# Patient Record
Sex: Female | Born: 1935 | Race: Black or African American | Hispanic: No
Health system: Southern US, Community
[De-identification: ages and names within clinical notes are randomized; demographics above are authoritative.]

## PROBLEM LIST (undated history)

## (undated) DIAGNOSIS — F0280 Dementia in other diseases classified elsewhere without behavioral disturbance: Secondary | ICD-10-CM

## (undated) DIAGNOSIS — I4891 Unspecified atrial fibrillation: Secondary | ICD-10-CM

## (undated) DIAGNOSIS — K5641 Fecal impaction: Secondary | ICD-10-CM

## (undated) DIAGNOSIS — K5900 Constipation, unspecified: Secondary | ICD-10-CM

## (undated) DIAGNOSIS — G309 Alzheimer's disease, unspecified: Secondary | ICD-10-CM

## (undated) DIAGNOSIS — J189 Pneumonia, unspecified organism: Secondary | ICD-10-CM

## (undated) DIAGNOSIS — I10 Essential (primary) hypertension: Secondary | ICD-10-CM

## (undated) DIAGNOSIS — M1990 Unspecified osteoarthritis, unspecified site: Secondary | ICD-10-CM

## (undated) HISTORY — PX: TOTAL KNEE ARTHROPLASTY: SHX125

---

## 2005-12-18 ENCOUNTER — Encounter: Payer: Self-pay | Admitting: Family Medicine

## 2005-12-27 ENCOUNTER — Encounter: Payer: Self-pay | Admitting: Family Medicine

## 2006-01-27 ENCOUNTER — Encounter: Payer: Self-pay | Admitting: Family Medicine

## 2017-03-16 ENCOUNTER — Emergency Department (HOSPITAL_COMMUNITY): Payer: Medicare Other

## 2017-03-16 ENCOUNTER — Emergency Department (HOSPITAL_COMMUNITY)
Admission: EM | Admit: 2017-03-16 | Discharge: 2017-03-16 | Disposition: A | Discharge status: Home / Self Care | Payer: Medicare Other | Attending: Emergency Medicine | Admitting: Emergency Medicine

## 2017-03-16 ENCOUNTER — Encounter (HOSPITAL_COMMUNITY): Payer: Self-pay | Admitting: *Deleted

## 2017-03-16 DIAGNOSIS — I10 Essential (primary) hypertension: Secondary | ICD-10-CM | POA: Insufficient documentation

## 2017-03-16 DIAGNOSIS — G309 Alzheimer's disease, unspecified: Secondary | ICD-10-CM | POA: Diagnosis not present

## 2017-03-16 DIAGNOSIS — K5939 Other megacolon: Secondary | ICD-10-CM | POA: Diagnosis not present

## 2017-03-16 DIAGNOSIS — K5900 Constipation, unspecified: Secondary | ICD-10-CM

## 2017-03-16 DIAGNOSIS — Z79899 Other long term (current) drug therapy: Secondary | ICD-10-CM | POA: Diagnosis not present

## 2017-03-16 DIAGNOSIS — R52 Pain, unspecified: Secondary | ICD-10-CM

## 2017-03-16 DIAGNOSIS — R05 Cough: Secondary | ICD-10-CM | POA: Diagnosis present

## 2017-03-16 HISTORY — DX: Unspecified atrial fibrillation: I48.91

## 2017-03-16 HISTORY — DX: Fecal impaction: K56.41

## 2017-03-16 HISTORY — DX: Constipation, unspecified: K59.00

## 2017-03-16 HISTORY — DX: Pneumonia, unspecified organism: J18.9

## 2017-03-16 HISTORY — DX: Unspecified osteoarthritis, unspecified site: M19.90

## 2017-03-16 HISTORY — DX: Alzheimer's disease, unspecified: G30.9

## 2017-03-16 HISTORY — DX: Dementia in other diseases classified elsewhere, unspecified severity, without behavioral disturbance, psychotic disturbance, mood disturbance, and anxiety: F02.80

## 2017-03-16 HISTORY — DX: Essential (primary) hypertension: I10

## 2017-03-16 LAB — COMPREHENSIVE METABOLIC PANEL
ALT: 7 U/L — AB (ref 14–54)
AST: 26 U/L (ref 15–41)
Albumin: 3.5 g/dL (ref 3.5–5.0)
Alkaline Phosphatase: 56 U/L (ref 38–126)
Anion gap: 8 (ref 5–15)
BILIRUBIN TOTAL: 0.6 mg/dL (ref 0.3–1.2)
BUN: 12 mg/dL (ref 6–20)
CO2: 25 mmol/L (ref 22–32)
CREATININE: 0.73 mg/dL (ref 0.44–1.00)
Calcium: 9.2 mg/dL (ref 8.9–10.3)
Chloride: 105 mmol/L (ref 101–111)
Glucose, Bld: 138 mg/dL — ABNORMAL HIGH (ref 65–99)
Potassium: 3.4 mmol/L — ABNORMAL LOW (ref 3.5–5.1)
Sodium: 138 mmol/L (ref 135–145)
Total Protein: 6.7 g/dL (ref 6.5–8.1)

## 2017-03-16 LAB — CBC WITH DIFFERENTIAL/PLATELET
Basophils Absolute: 0 10*3/uL (ref 0.0–0.1)
Basophils Relative: 0 %
EOS PCT: 0 %
Eosinophils Absolute: 0 10*3/uL (ref 0.0–0.7)
HEMATOCRIT: 35.4 % — AB (ref 36.0–46.0)
HEMOGLOBIN: 12 g/dL (ref 12.0–15.0)
LYMPHS ABS: 1.5 10*3/uL (ref 0.7–4.0)
LYMPHS PCT: 37 %
MCH: 31.5 pg (ref 26.0–34.0)
MCHC: 33.9 g/dL (ref 30.0–36.0)
MCV: 92.9 fL (ref 78.0–100.0)
Monocytes Absolute: 0.5 10*3/uL (ref 0.1–1.0)
Monocytes Relative: 12 %
NEUTROS ABS: 2 10*3/uL (ref 1.7–7.7)
Neutrophils Relative %: 51 %
PLATELETS: 136 10*3/uL — AB (ref 150–400)
RBC: 3.81 MIL/uL — AB (ref 3.87–5.11)
RDW: 14.5 % (ref 11.5–15.5)
WBC: 3.9 10*3/uL — AB (ref 4.0–10.5)

## 2017-03-16 LAB — LIPASE, BLOOD: LIPASE: 23 U/L (ref 11–51)

## 2017-03-16 MED ORDER — SODIUM CHLORIDE 0.9 % IV BOLUS (SEPSIS)
500.0000 mL | Freq: Once | INTRAVENOUS | Status: AC
  Administered 2017-03-16: 500 mL via INTRAVENOUS

## 2017-03-16 MED ORDER — IOPAMIDOL (ISOVUE-300) INJECTION 61%
INTRAVENOUS | Status: AC
  Filled 2017-03-16: qty 30

## 2017-03-16 NOTE — ED Triage Notes (Signed)
Sent by [facility], cxr done and noted "free floating air"

## 2017-03-16 NOTE — ED Notes (Signed)
Pt returned from xray at this time.

## 2017-03-16 NOTE — ED Notes (Signed)
No family in room or lobby at this time. This RN contacted emergency contact and left message in attempts to d/c pt.

## 2017-03-16 NOTE — Discharge Instructions (Signed)
CT scan shows a dilated bowel, but this appears to be a chronic condition. Also she has some constipation issues. Recommend magnesium citrate, Miralax, fleets enema.

## 2017-03-16 NOTE — ED Notes (Signed)
EKG given to MD Cook. 

## 2017-03-16 NOTE — ED Notes (Signed)
Patient transported to X-ray 

## 2017-03-16 NOTE — ED Triage Notes (Signed)
Pt has never been here with [facility],  Normally goes to [facility]

## 2017-03-16 NOTE — ED Notes (Signed)
Pt to CT at this time.

## 2017-03-16 NOTE — ED Provider Notes (Signed)
[facility]-EMERGENCY DEPT Provider Note   CSN: [id number] Arrival date & time: 03/16/17  1538     History   Chief Complaint Chief Complaint  Patient presents with  . Cough    HPI Isabel Beck is a 83 y.o. female.  Level V caveat for dementia.   Patient was seen by her primary care doctor today for a cough.  A chest x-ray was performed. It was determined that she had "free air" on her x-ray. She was sent to the emergency department for further evaluation. No fever, chills, productive sputum, abdominal pain. She does have abdominal distention, but this is not new.      Past Medical History:  Diagnosis Date  . A-fib (HCC)   . Alzheimer disease   . Arthritis   . Constipation   . Fecal impaction (HCC)   . Hypertension   . Pneumonia     There are no active problems to display for this patient.   Past Surgical History:  Procedure Laterality Date  . TOTAL KNEE ARTHROPLASTY      OB History    No data available       Home Medications    Prior to Admission medications   Medication Sig Start Date End Date Taking? Authorizing Provider  acetaminophen (TYLENOL) 500 MG tablet Take 500 mg by mouth every 6 (six) hours as needed for mild pain.   Yes [provider]  amLODipine (NORVASC) 10 MG tablet Take 10 mg by mouth daily.    Yes [provider]  Calcium Carb-Cholecalciferol (CALCIUM 500+D) 500-400 MG-UNIT TABS Take 1 tablet by mouth 2 (two) times daily.   Yes [provider]  docusate sodium (COLACE) 100 MG capsule Take 100 mg by mouth daily.    Yes [provider]  ferrous sulfate 325 (65 FE) MG tablet Take 325 mg by mouth daily with breakfast.   Yes [provider]  Infant Care Products (DERMACLOUD) CREA Apply 1 application topically daily as needed.   Yes [provider]  lactulose (CHRONULAC) 10 GM/15ML solution Take 20 g by mouth daily as needed for mild constipation.   Yes [provider]  omeprazole (PRILOSEC)  20 MG capsule Take 20 mg by mouth every morning.   Yes [provider]  polyethylene glycol powder (GLYCOLAX/MIRALAX) powder Take 17 g by mouth daily.   Yes [provider]  risperiDONE (RISPERDAL) 1 MG tablet Take 1 mg by mouth at bedtime.    Yes [provider]  senna (SENOKOT) 8.6 MG tablet Take 1 tablet by mouth daily as needed for constipation.    Yes [provider]    Family History No family history on file.  Social History Social History  Substance Use Topics  . Smoking status: Unknown If Ever Smoked  . Smokeless tobacco: Not on file  . Alcohol use Not on file     Allergies   Lovastatin; Pregabalin; and Sulfa antibiotics   Review of Systems Review of Systems  Unable to perform ROS: Dementia     Physical Exam Updated Vital Signs BP 133/75   Pulse 74   Temp 98.9 F (37.2 C) (Oral)   Resp (!) 21   SpO2 99%   Physical Exam  Constitutional:  Demented, pleasant, no acute distress  HENT:  Head: Normocephalic and atraumatic.  Eyes: Conjunctivae are normal.  Neck: Neck supple.  Cardiovascular: Normal rate and regular rhythm.   Pulmonary/Chest: Effort normal and breath sounds normal.  Abdominal:  Distended, but no acute  abdomen  Musculoskeletal: Normal range of motion.  Neurological: She is alert. Cranial nerve deficit:  another CT at a.  Skin: Skin is warm and dry.  Psychiatric:  demented  Nursing note and vitals reviewed.    ED Treatments / Results  Labs (all labs ordered are listed, but only abnormal results are displayed) Labs Reviewed  CBC WITH DIFFERENTIAL/PLATELET - Abnormal; Notable for the following:       Result Value   WBC 3.9 (*)    RBC 3.81 (*)    HCT 35.4 (*)    Platelets 136 (*)    All other components within normal limits  COMPREHENSIVE METABOLIC PANEL - Abnormal; Notable for the following:    Potassium 3.4 (*)    Glucose, Bld 138 (*)    ALT 7 (*)    All other components within normal limits    LIPASE, BLOOD    EKG  EKG Interpretation  Date/Time:  Tuesday March 16 2017 16:09:14 EDT Ventricular Rate:  70 PR Interval:  152 QRS Duration: 82 QT Interval:  398 QTC Calculation: 429 R Axis:   -27 Text Interpretation:  Sinus rhythm with marked sinus arrhythmia Otherwise normal ECG Confirmed by Donnetta Hutching ([id number]) on 03/16/2017 5:19:29 PM       Radiology Ct Abdomen Pelvis Wo Contrast  Result Date: 03/16/2017 CLINICAL DATA:  Abdominal distention. EXAM: CT ABDOMEN AND PELVIS WITHOUT CONTRAST TECHNIQUE: Multidetector CT imaging of the abdomen and pelvis was performed following the standard protocol without IV contrast. COMPARISON:  Acute abdominal series from earlier today FINDINGS: Lower chest: There is low lung volumes with atelectatic opacity. Generalized airway thickening. Extensive atherosclerotic calcification in the coronaries. Hepatobiliary: No significant hepatic finding.No evidence of biliary obstruction or stone. Pancreas: Generalized atrophy.  No acute finding. Spleen: Unremarkable. Adrenals/Urinary Tract: Negative adrenals. No hydronephrosis or stone. 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 More inferiorly is a similarly sized cystic density. Deep portions of the bladder are obscured by streak artifact from hip prosthesis. No acute bladder finding. Stomach/Bowel: The sigmoid colon is patulous and tortuous, reaching the diaphragm. The lumen measures up to 12 cm in diameter. There is semi formed stool in the distal sigmoid and rectum without over distension at these levels to suggest that the stool is obstructive. No obstruction or mesenteric twist. No evidence of small bowel obstruction. Negative appendix. Vascular/Lymphatic: No acute vascular abnormality. Atherosclerotic calcification. No mass or adenopathy. Reproductive:No pathologic findings. Other: No ascites or pneumoperitoneum. Musculoskeletal: Left hip arthroplasty and severe right hip  osteoarthritis. Spondylosis and multilevel ankylosis of the thoracic spine. Advanced lumbar disc and facet degeneration. No acute osseous finding. IMPRESSION: 1. Dilated and tortuous sigmoid colon without volvulus. The distal sigmoid and rectum contains stool that is not convincingly obstructive. A similar pattern was described on outside abdominal CT report in care everywhere, suggesting chronic patulous colon rather than a colonic ileus. 2. Chronic findings are described above. Electronically Signed   By: Marnee Spring M.D.   On: 03/16/2017 20:40   Dg Abd Acute W/chest  Result Date: 03/16/2017 CLINICAL DATA:  Abdominal pain. EXAM: DG ABDOMEN ACUTE W/ 1V CHEST COMPARISON:  None. FINDINGS: The sigmoid colon is markedly distended, 15 cm and single wall dimension. It also has a coffee bean shape and extends above the level of the transverse colon. The colon is diffusely gas distended, except at the rectum, which is not visible. No visible pneumoperitoneum. Lung volumes are low and there is interstitial coarsening that  is likely atelectatic based on the upright view. Right paratracheal nodular density, usually from ectatic vessels at this age but adenopathy or mass is not excluded. No effusion or pneumothorax. IMPRESSION: 1. Abnormally dilated colon with pattern concerning for sigmoid volvulus, which could be evaluated with CT. No evidence of pneumatosis or pneumoperitoneum. 2. Low volume chest with atelectatic changes. 3. Right paratracheal nodular density, usually from ectatic vessels at this age, but adenopathy is not excluded. If available, an outside chest x-ray could determine chronicity. Electronically Signed   By: Marnee Spring M.D.   On: 03/16/2017 17:39    Procedures Procedures (including critical care time)  Medications Ordered in ED Medications  iopamidol (ISOVUE-300) 61 % injection (not administered)  sodium chloride 0.9 % bolus 500 mL (0 mLs Intravenous Stopped 03/16/17 1736)      Initial Impression / Assessment and Plan / ED Course  I have reviewed the triage vital signs and the nursing notes.  Pertinent labs & imaging results that were available during my care of the patient were reviewed by me and considered in my medical decision making (see chart for details).     No free air noted on x-ray.  CT abdomen pelvis reveals a dilated and tortuous sigmoid colon without volvulus. No obvious obstruction. CT today is similar to previous CT.  We'll refer back to primary care.  Final Clinical Impressions(s) / ED Diagnoses   Final diagnoses:  Dilatation of colon  Constipation, unspecified constipation type    New Prescriptions New Prescriptions   No medications on file     Donnetta Hutching, MD 03/17/17 1911

## 2018-10-28 DEATH — deceased

## 2018-11-05 IMAGING — DX DG ABDOMEN ACUTE W/ 1V CHEST
4 series · 4 of 4 positions shown · non-contrast
Comparison: None.

CLINICAL DATA: Abdominal pain.

EXAM:
DG ABDOMEN ACUTE W/ 1V CHEST

[abdomen erect]
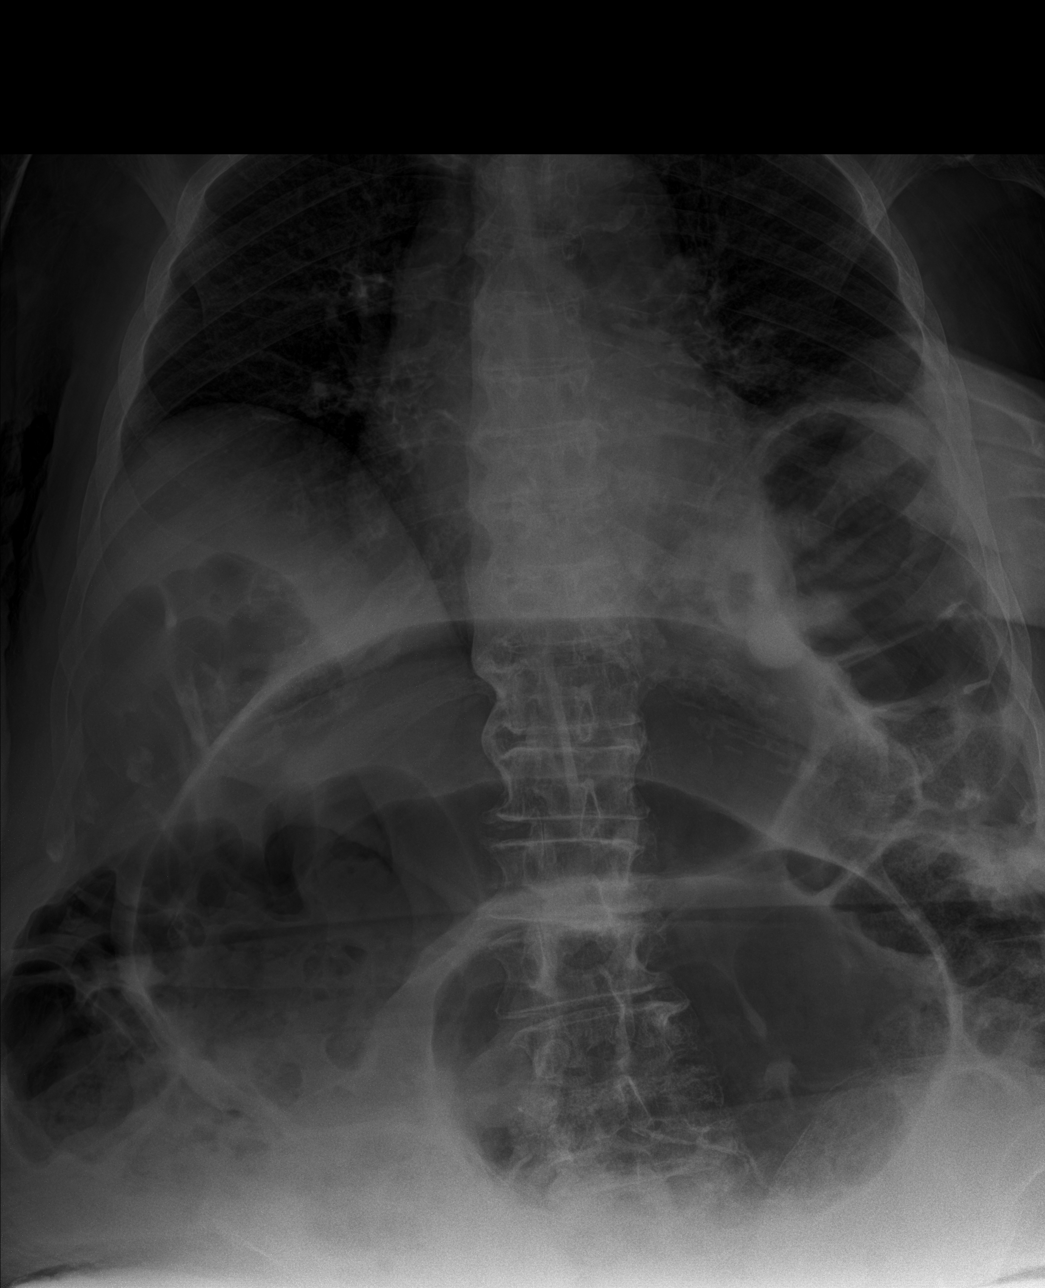

[abdomen supine (1 of 2)]
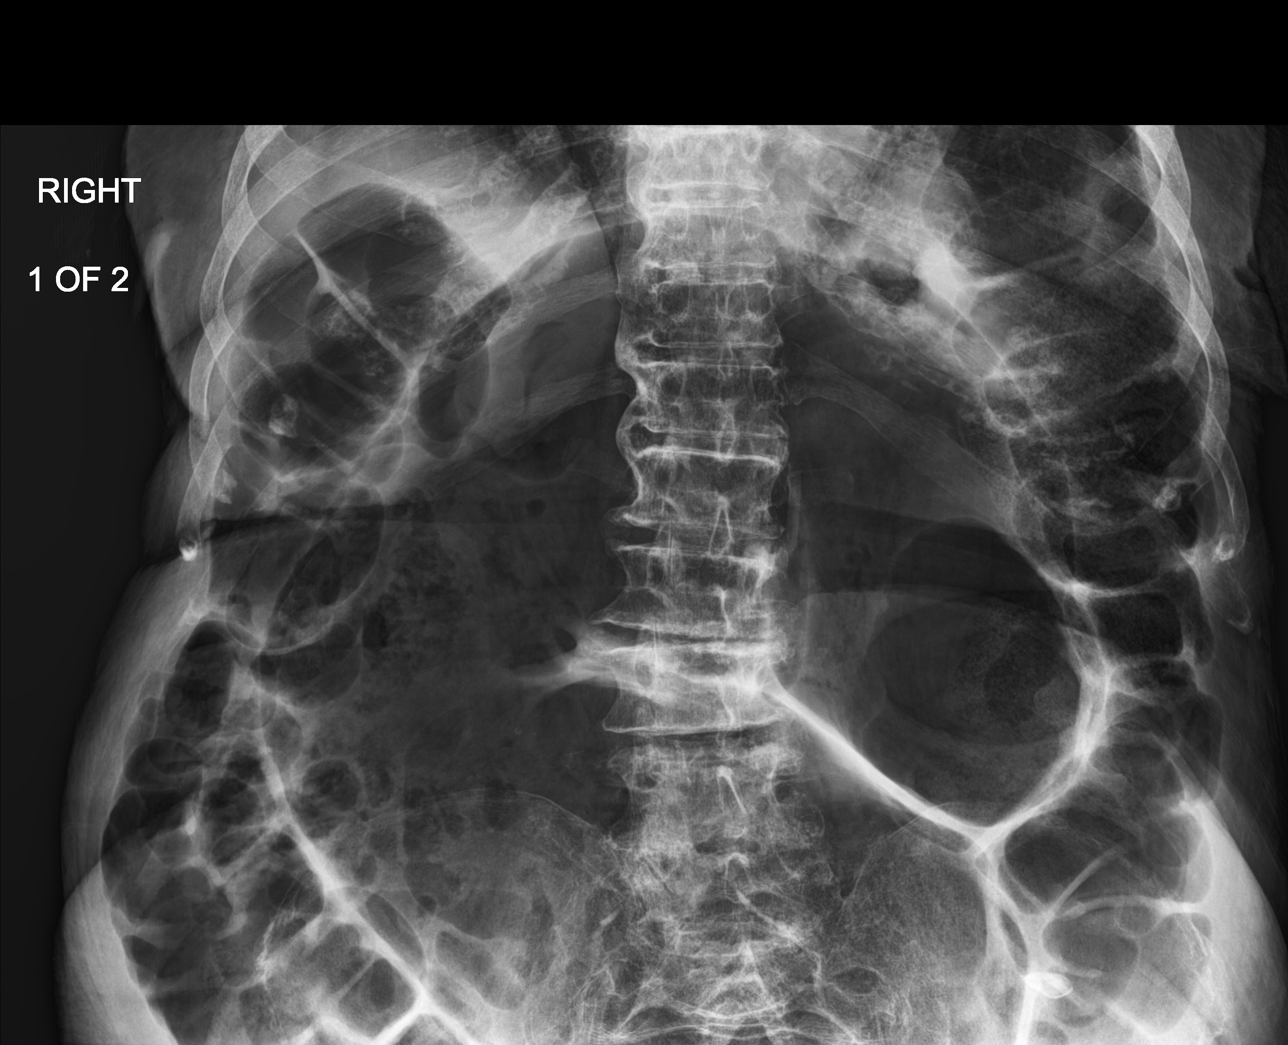

[abdomen supine (2 of 2)]
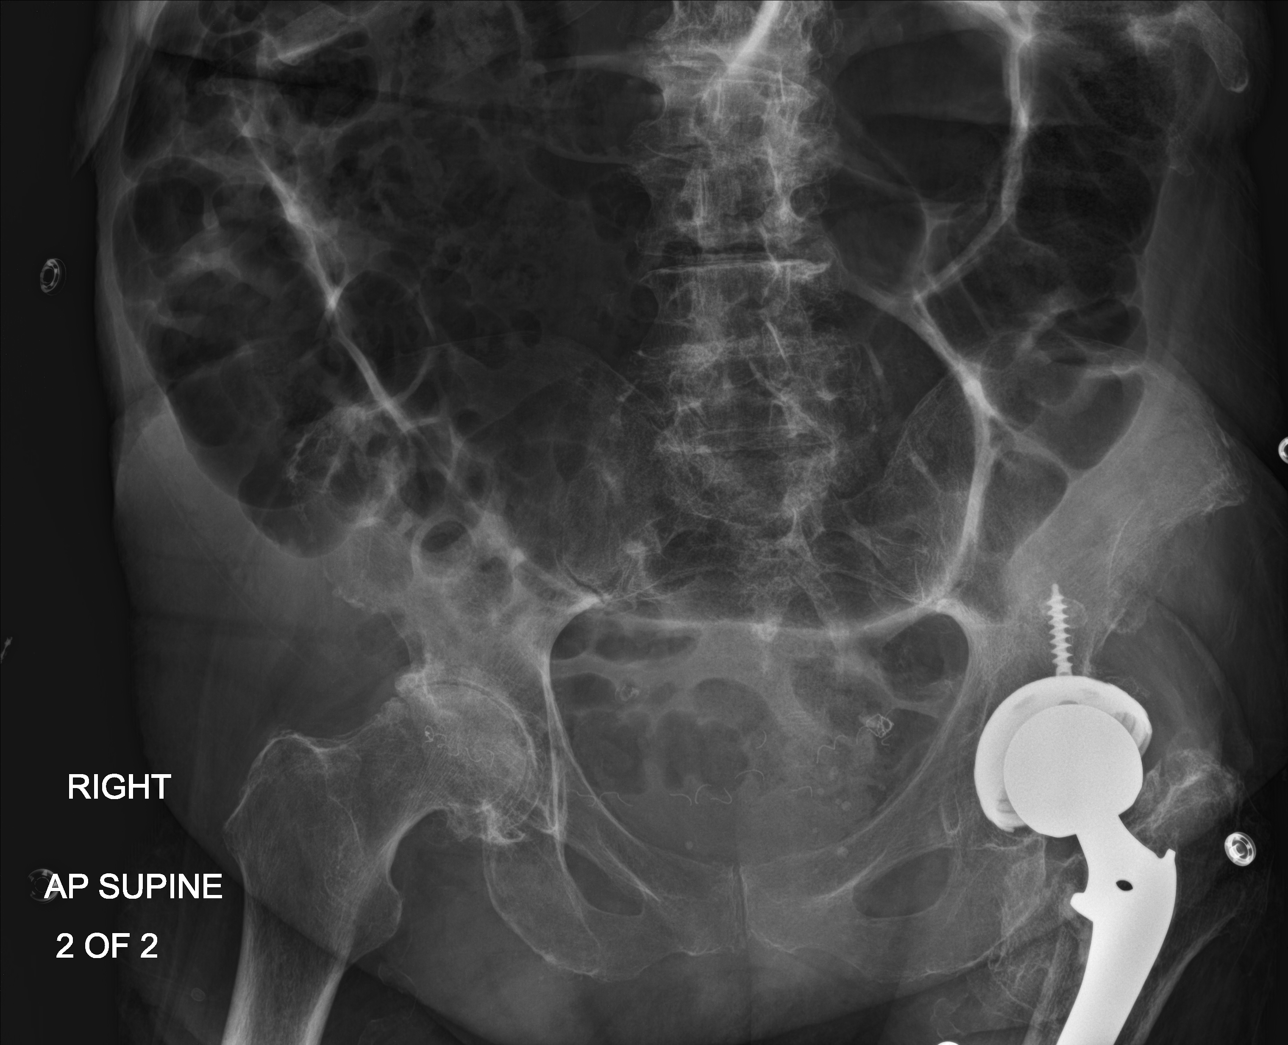

[chest ap]
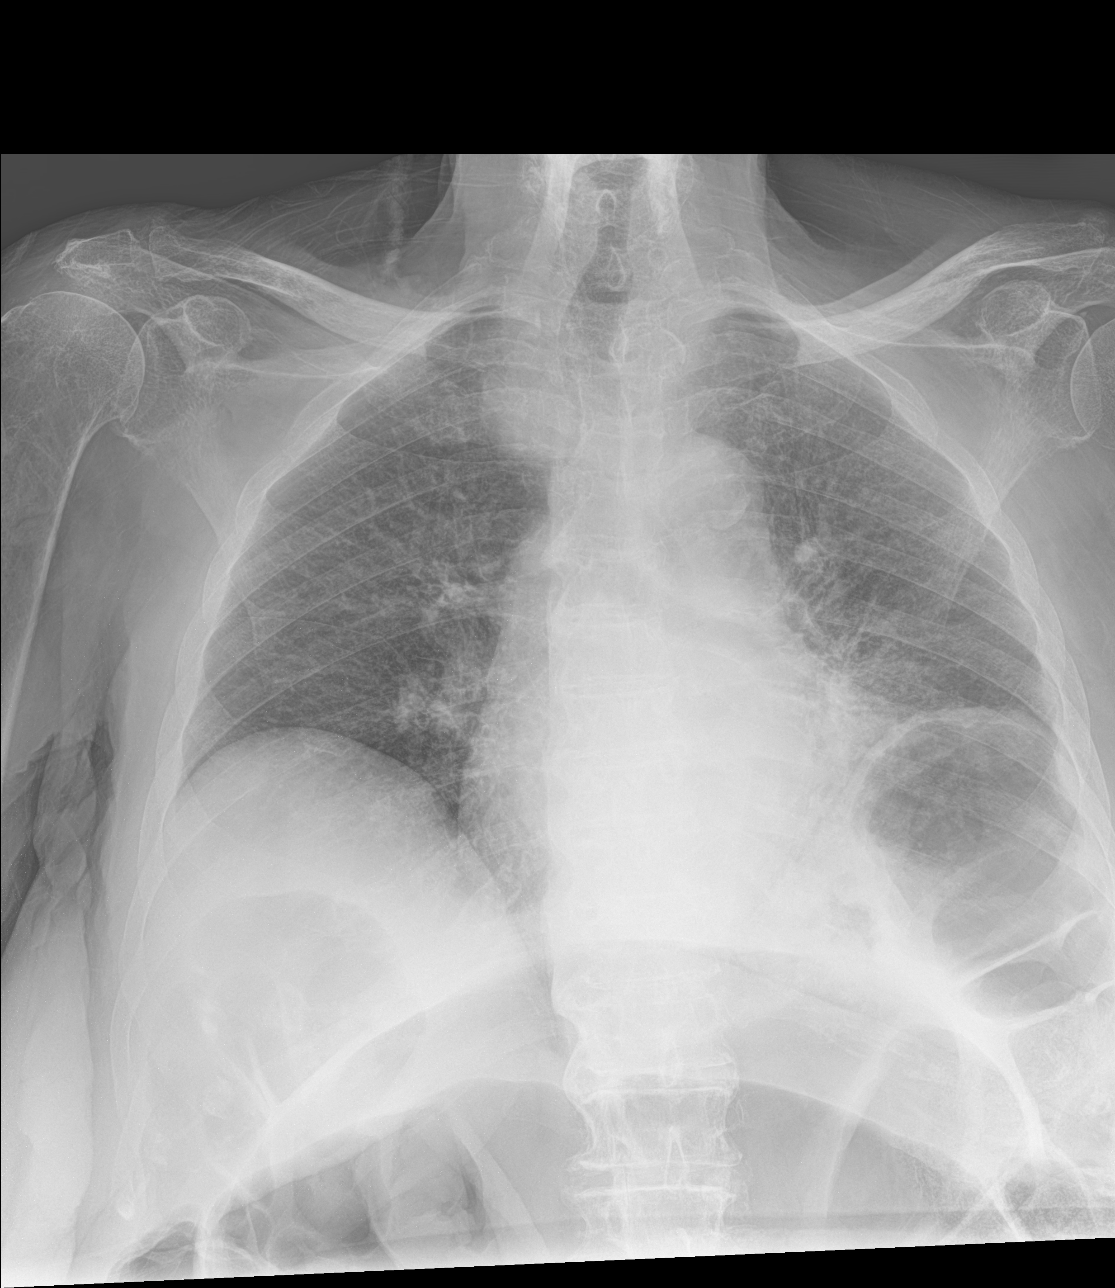

[4 of 4 positions shown; findings below may reference images not displayed]

FINDINGS: The sigmoid colon is markedly distended, 15 cm and single wall
dimension. It also has Hebri Abdoun shape and extends above the
level of the transverse colon. The colon is diffusely gas distended,
except at the rectum, which is not visible. No visible
pneumoperitoneum.

Lung volumes are low and there is interstitial coarsening that is
likely atelectatic based on the upright view. Right paratracheal
nodular density, usually from ectatic vessels at this age but
adenopathy or mass is not excluded. No effusion or pneumothorax.
IMPRESSION: 1. Abnormally dilated colon with pattern concerning for sigmoid
volvulus, which could be evaluated with CT. No evidence of
pneumatosis or pneumoperitoneum.
2. Low volume chest with atelectatic changes.
3. Right paratracheal nodular density, usually from ectatic vessels
at this age, but adenopathy is not excluded. If available, an
outside chest x-ray could determine chronicity.
